# Patient Record
Sex: Male | Born: 1990 | Race: White | Hispanic: No | Marital: Single | Smoking: Never smoker
Health system: Southern US, Community
[De-identification: ages and names within clinical notes are randomized; demographics above are authoritative.]

## PROBLEM LIST (undated history)

## (undated) HISTORY — PX: CARDIAC SURGERY: SHX584

---
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
## 2009-05-24 ENCOUNTER — Ambulatory Visit (HOSPITAL_BASED_OUTPATIENT_CLINIC_OR_DEPARTMENT_OTHER): Admission: RE | Admit: 2009-05-24 | Discharge: 2009-05-24 | Payer: Self-pay | Admitting: Oral Surgery

## 2011-02-18 NOTE — Op Note (Signed)
NAMEESTUARDO, Cole Rios                ACCOUNT NO.:  [id number]   MEDICAL RECORD NO.:  [id number]          PATIENT TYPE:  AMB   LOCATION:  DSC                          FACILITY:  [facility]   PHYSICIAN:  Hinton Dyer, D.D.S.DATE OF BIRTH:  29-Mar-1991   DATE OF PROCEDURE:  05/24/2009  DATE OF DISCHARGE:                               OPERATIVE REPORT   PREOPERATIVE DIAGNOSES:  Multiple impacted teeth and retained primary  teeth.   POSTOPERATIVE DIAGNOSES:  Multiple impacted teeth and retained primary  teeth.   PROCEDURES:  1. Surgical removal of 4 impacted wisdom teeth #1, 16, 17, and 32.  2. Removal of retained primary teeth numbers B, L, R, S, and exposure      of impacted second molars #2, 15, 18, 31, and removal of impacted      bicuspid #5 and #12.   ANESTHESIA:  General.   SURGEON:  Hinton Dyer, DDS   ASSISTANTS:  1. Ray Montez Morita.  2. Montel Culver.   ESTIMATED BLOOD LOSS:  30 mL.   CONDITION ON SURGERY:  Good.   Following preoperative medication, the patient was brought to the  operating room in supine position in which he remained throughout the  whole procedure.  He was intubated by a right nasoendotracheal tube and  prepped and draped in the usual fashion for an intraoral procedure.  The  mouth was suctioned dry.  The throat was suctioned dry and a moist open  4 x 4 gauze was placed around the endotracheal tube.  Four carpules of  2% Xylocaine with 1:100,000 epinephrine was given as bilateral blocks  and infiltrations of the maxilla and palate.  A child-sized bite block  was used.  Starting on the patient's left side, an incision was made  over the left tuberosity removing a block of tissue covering tooth #15.  A full-thickness mucoperiosteal flap was elevated with a periosteal  elevator around the impacted wisdom tooth.  The soft tissue and bone  covering the wisdom tooth was removed with a periosteal elevator and a  rongeur.  Once the tooth could be visualized, it  was elevated out of the  socket with an 11-A elevator.  The socket was curetted and irrigated and  the soft tissue was closed with 3-0 chromic sutures.  The impacted tooth  #15 was still exposed.  An incision was then made over the left  retromolar pad extending to tooth #19.  A wedge of tissue was removed  covering the impacted tooth #18.  A full-thickness mucoperiosteal flap  was elevated distal to this.  The soft tissue covering the impacted  tooth #18 was removed with a rongeur and a periosteal elevator.  The  area where the impacted wisdom tooth was was unroofed using a round bur  and copious irrigation until the crown could be visualized.  It was then  sectioned longitudinally and split with an 11-A elevator.  Each part was  then removed with an 11-A elevator and the socket was curetted and  irrigated.  The soft tissue was then closed in a way to keep #18  exposed.  3-0 chromic sutures were used.  The primary retained molar  tooth L was removed with a rongeur.  The socket was curetted and the  soft tissue was removed with a rongeur.  On the patient's right side,  the 2 primary teeth, R and S were then removed using a lower universal  forceps.  The sockets were curetted and soft tissue was removed with a  rongeur.  A 15 blade made an incision over the retromolar pad and a  wedge was removed covering tooth #31.  Soft tissue covering 31  underneath the flap was also removed with a periosteal elevator and a  rongeur.  The bone covering #32 was then removed with a round bur and  copious irrigation.  The tooth was sectioned longitudinally with a round  bur and split with an 11-A elevator.  Each portion was removed with an  11-A elevator and the socket was curetted aggressively and irrigated.  The bone covering a part of 31 was removed with a round bur exposing the  crown.  The soft tissue was then closed in a way that 31 was still  exposed using 3-0 chromic sutures.  Attention was then  turned to the  maxilla where a 15 blade was used to make an incision over the right  tuberosity with the wedge removed covering tooth #2.  The wedge was  removed with a rongeur and the soft tissue was removed exposing the  crown.  The flap was then developed so that the palpably impacted tooth  #1 was exposed and then a rongeur was used to remove bone covering the  crown.  The tooth was then elevated out of the socket with an 11-A  elevator.  There was no supernumerary that could be visualized.  The  socket was curetted and the soft tissue was closed so that the impacted  #2 was still visualized.  The primary tooth B was then removed with a  rongeur.  A 15 blade made an incision on the palate so as a flap could  be developed using a periosteal elevator to expose the impacted tooth  #5.  It was mobilized with an 11-A elevator and removed with an upper  Universal forceps.  The socket was curetted and closed with 3-0 chromic  sutures.  A periosteal elevator went around tooth #12 and it was removed  with an upper Universal forceps.  The socket was curetted.  The buccal  plates were intact.  The soft tissue was closed with a 3-0 chromic  suture.  The patient tolerated the procedure well.  The throat pack was  removed, and the patient was extubated on the table and returned to the  recovery room in good condition.  He was given written home care and  diet instruction after discharge and was sent home with a prescription  for Vicodin.  The patient will be followed by me in my private office.           ______________________________  Hinton Dyer, D.D.S.     JLM/MEDQ  D:  05/24/2009  T:  05/24/2009  Job:  [id number]

## 2014-06-06 ENCOUNTER — Ambulatory Visit
Admission: RE | Admit: 2014-06-06 | Discharge: 2014-06-06 | Disposition: A | Discharge status: Home / Self Care | Payer: BC Managed Care – PPO | Source: Ambulatory Visit | Attending: Family Medicine | Admitting: Family Medicine

## 2014-06-06 ENCOUNTER — Other Ambulatory Visit: Payer: Self-pay | Admitting: Family Medicine

## 2014-06-06 DIAGNOSIS — Q909 Down syndrome, unspecified: Secondary | ICD-10-CM

## 2016-02-18 IMAGING — CR DG CERVICAL SPINE 2 OR 3 VIEWS
3 series · 3 of 3 positions shown · non-contrast
Comparison: None.

CLINICAL DATA: History of Down syndrome check for instability

EXAM:
CERVICAL SPINE - 2-3 VIEW

[w c-spine lat (1 of 3)]
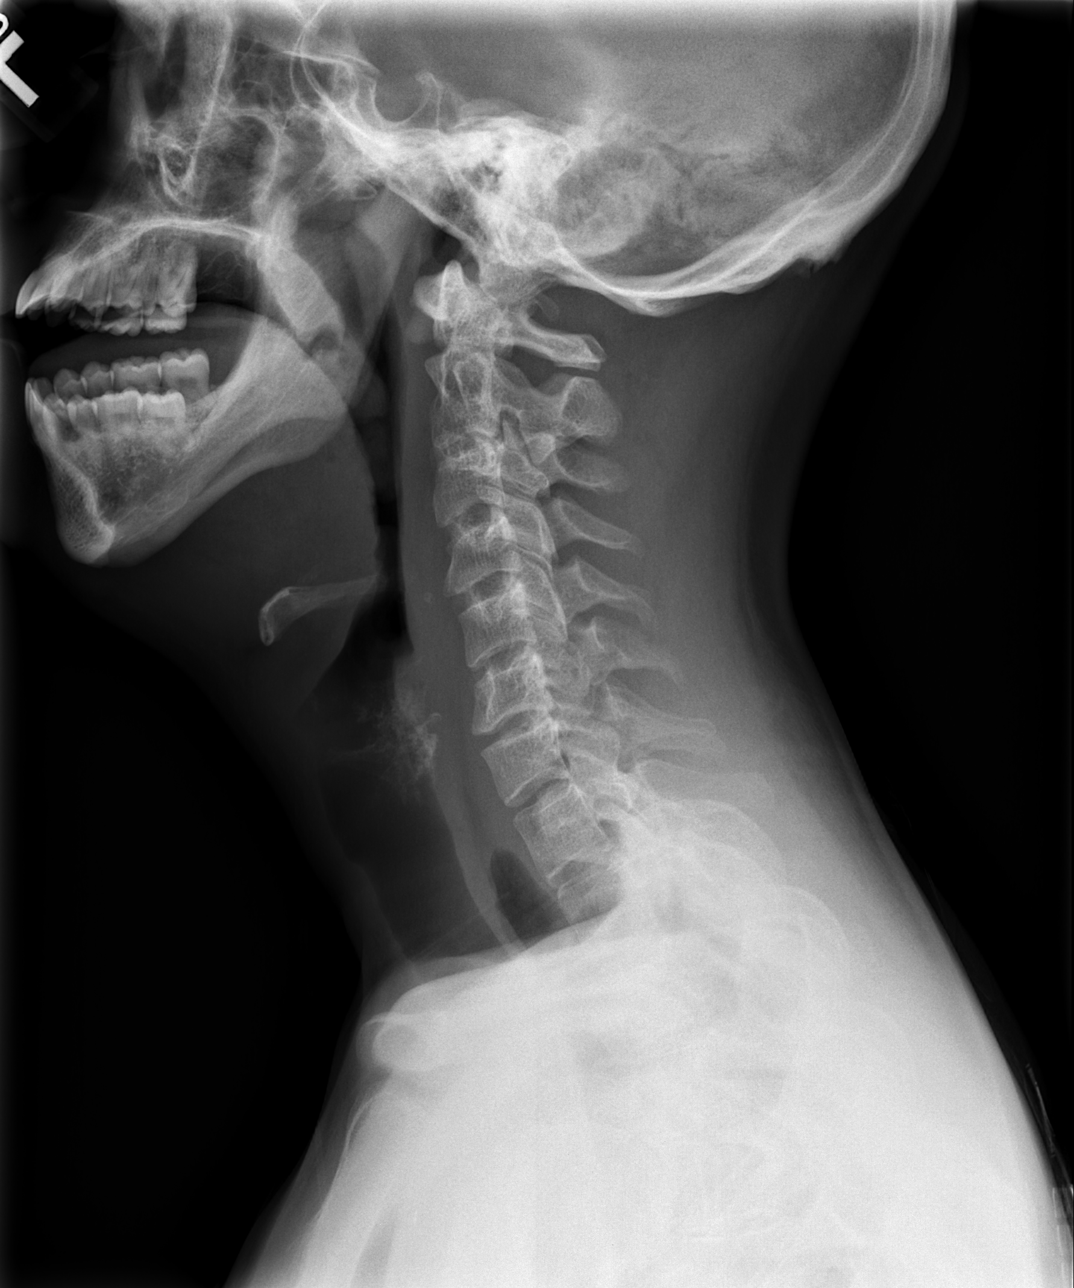

[w c-spine lat (2 of 3)]
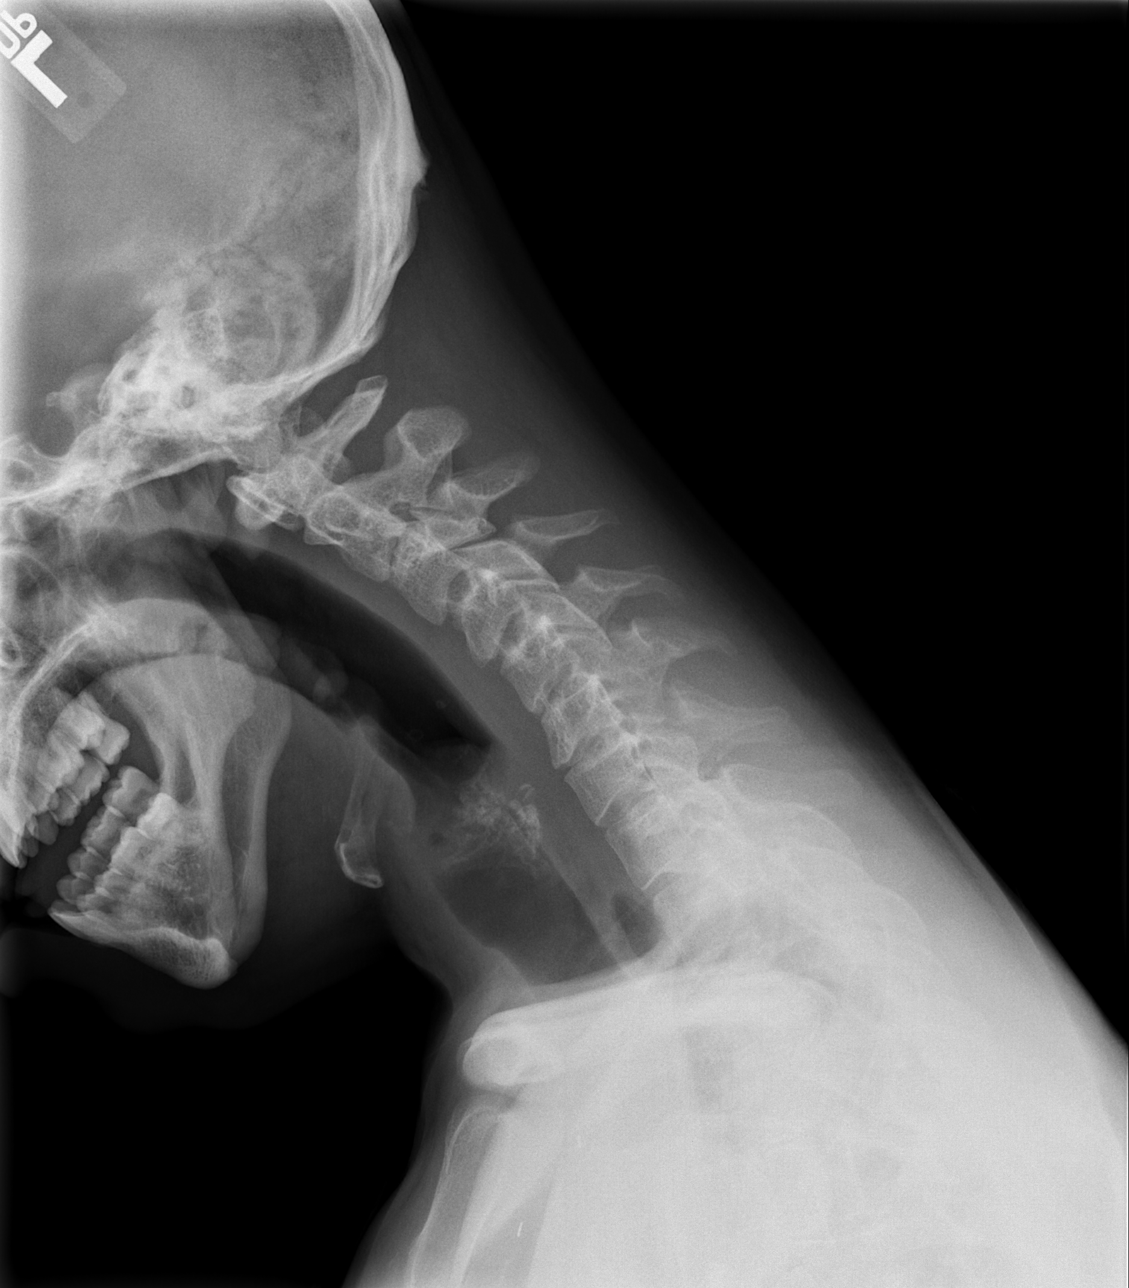

[w c-spine lat (3 of 3)]
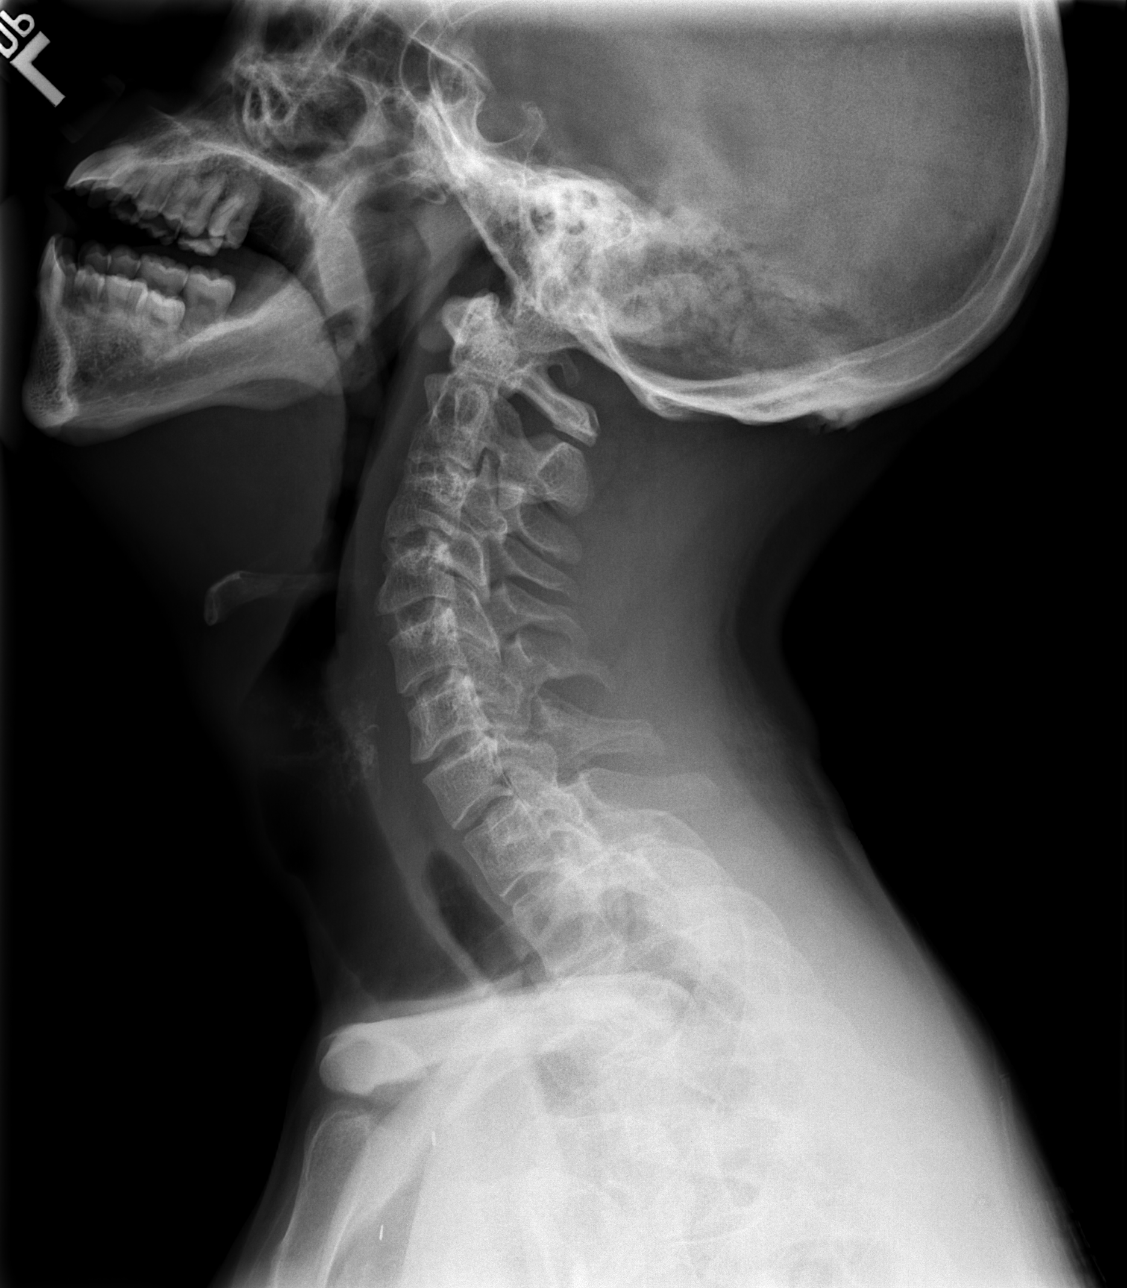

[3 of 3 positions shown; findings below may reference images not displayed]

FINDINGS: Neutral flexion and extension views of the cervical spine were
obtained. No acute bony abnormality is noted. On flexion and
extension no significant instability is seen. No prevertebral soft
tissue changes are noted.
IMPRESSION: No acute abnormality seen.

## 2018-06-08 ENCOUNTER — Encounter: Payer: Self-pay | Admitting: Podiatry

## 2018-06-08 ENCOUNTER — Ambulatory Visit (INDEPENDENT_AMBULATORY_CARE_PROVIDER_SITE_OTHER): Payer: Medicaid Other | Admitting: Podiatry

## 2018-06-08 DIAGNOSIS — M79674 Pain in right toe(s): Secondary | ICD-10-CM | POA: Diagnosis not present

## 2018-06-08 DIAGNOSIS — M79675 Pain in left toe(s): Secondary | ICD-10-CM | POA: Diagnosis not present

## 2018-06-08 DIAGNOSIS — B351 Tinea unguium: Secondary | ICD-10-CM | POA: Diagnosis not present

## 2018-06-08 NOTE — Progress Notes (Signed)
   Subjective:    Patient ID: Cole Rios, male    DOB: [birth year], 27 y.o.   MRN: [id number]  HPI 27 year old male presents the office today with his parents for concerns of toenail thickening discoloration of the second digit toenails.  This is been getting worse for the last 6 weeks.  He has been using ciclopirox without any significant improvement.  Nails, occasionally uncomfortable as are getting thick.  Denies any redness or drainage or any swelling.  No other concerns today.   Review of Systems  All other systems reviewed and are negative.  History reviewed. No pertinent past medical history.  History reviewed. No pertinent surgical history.   Current Outpatient Medications:  Marland Kitchen  Multiple Vitamin (MULTIVITAMIN) tablet, Take 1 tablet by mouth daily., Disp: , Rfl:  .  NON FORMULARY, [facility]  Onychomycosis Nail Lacquer -  Fluconazole 2%, Terbinafine 1% DMSO Apply to affected nail once daily Qty. 120 gm 3 refills, Disp: , Rfl:   No Known Allergies       Objective:   Physical Exam General: NAD  Dermatological: Nails are hypertrophic, dystrophic, brittle, discolored, elongated 10.  Second digit toenails are the worst.  No surrounding redness or drainage. Tenderness nails 1-5 bilaterally. No open lesions or pre-ulcerative lesions are identified today.  Vascular: Dorsalis Pedis artery and Posterior Tibial artery pedal pulses are 2/4 bilateral with immedate capillary fill time. There is no pain with calf compression, swelling, warmth, erythema.   Neruologic: Grossly intact via light touch bilateral. . Protective threshold with Semmes Wienstein monofilament intact to all pedal sites bilateral.   Musculoskeletal: No gross boney pedal deformities bilateral. No pain, crepitus, or limitation noted with foot and ankle range of motion bilateral. Muscular strength 5/5 in all groups tested bilateral.  Gait: Unassisted, Nonantalgic.     Assessment & Plan:  27 year old male  with symptomatic onychomycosis -Treatment options discussed including all alternatives, risks, and complications -Etiology of symptoms were discussed -We discussed various treatment options.  Patients with Down syndrome do a high incidence of onychomycosis that we discussed.  They want to proceed with treatment.  I ordered a compound ointment through [employer] we discussed application instructions, side effects and success rates.  As a courtesy I debrided the nails without any complications or bleeding.  Vivi Barrack DPM

## 2018-06-09 ENCOUNTER — Telehealth: Payer: Self-pay | Admitting: Podiatry

## 2018-06-09 NOTE — Telephone Encounter (Signed)
This is [name], calling in regards to my son Cole Rios who saw Dr. Ardelle Anton yesterday. I was wanting to speak to Misty Stanley about the prescription that was prescribed. If you could give me a call at [phone]. Thank you. Bye bye.

## 2018-06-09 NOTE — Telephone Encounter (Signed)
Pt's mtr, Nicholos Johns asked if Dr. Ardelle Anton really liked the combination medication he prescribed for pt yesterday, because Medicaid did not cover. I told Nicholos Johns we prescribed the [facility] Nail lacquer a lot and had good results.

## 2018-09-07 ENCOUNTER — Ambulatory Visit (INDEPENDENT_AMBULATORY_CARE_PROVIDER_SITE_OTHER): Payer: Medicaid Other | Admitting: Podiatry

## 2018-09-07 ENCOUNTER — Encounter: Payer: Self-pay | Admitting: Podiatry

## 2018-09-07 DIAGNOSIS — B351 Tinea unguium: Secondary | ICD-10-CM

## 2018-09-13 NOTE — Progress Notes (Signed)
Subjective: 27 year old male presents the office today for follow-up evaluation of thick, painful, elongated toenails that he cannot trim himself as well as from the nail fungus.  They have been continue the nail fungus treatment anything the color is getting better.  No redness or drainage or swelling to the toenail sites. Denies any systemic complaints such as fevers, chills, nausea, vomiting. No acute changes since last appointment, and no other complaints at this time.   Objective: AAO x3, NAD DP/PT pulses palpable bilaterally, CRT less than 3 seconds Nails are hypertrophic, dystrophic, discolored with yellow to brown discoloration.  There is no pain in the nails there is no surrounding redness or drainage or any signs of infection identified today. No open lesions or pre-ulcerative lesions.  No pain with calf compression, swelling, warmth, erythema  Assessment: Onychomycosis  Plan: -All treatment options discussed with the patient including all alternatives, risks, complications.  -As a courtesy I debrided the nails without any complications or bleeding.  We will continue the topical antifungal medicine as well. -Patient encouraged to call the office with any questions, concerns, change in symptoms.   Vivi BarrackMatthew R Wagoner DPM

## 2018-11-16 ENCOUNTER — Telehealth: Payer: Self-pay | Admitting: Podiatry

## 2018-11-16 MED ORDER — NONFORMULARY OR COMPOUNDED ITEM: 5 refills | Status: DC

## 2018-11-16 NOTE — Telephone Encounter (Signed)
I informed pt's mtr, Nicholos Johns of the change to [place] [phone], and they would call with coverage as well as delivery information. Faxed orders to [facility].

## 2018-11-16 NOTE — Telephone Encounter (Signed)
Pt mom was previously receiving medication from [facility]. Needs a refill. Please call patient

## 2018-11-16 NOTE — Addendum Note (Signed)
Addended by: Alphia Kava D on: 11/16/2018 02:14 PM   Modules accepted: Orders

## 2018-12-07 ENCOUNTER — Ambulatory Visit: Payer: Medicaid Other | Admitting: Podiatry

## 2018-12-10 ENCOUNTER — Ambulatory Visit: Payer: Medicaid Other | Admitting: Podiatry

## 2018-12-10 ENCOUNTER — Encounter: Payer: Self-pay | Admitting: Podiatry

## 2018-12-10 DIAGNOSIS — M79674 Pain in right toe(s): Secondary | ICD-10-CM | POA: Diagnosis not present

## 2018-12-10 DIAGNOSIS — M79675 Pain in left toe(s): Secondary | ICD-10-CM | POA: Diagnosis not present

## 2018-12-10 DIAGNOSIS — B351 Tinea unguium: Secondary | ICD-10-CM

## 2018-12-10 NOTE — Patient Instructions (Signed)

## 2018-12-22 ENCOUNTER — Encounter: Payer: Self-pay | Admitting: Podiatry

## 2018-12-22 NOTE — Progress Notes (Signed)
Subjective: AMPARO COSTELLA presents today accompanied by his parents with painful, thick toenails 1-5 b/l that he cannot cut and which interfere with daily activities.  Pain is aggravated when wearing enclosed shoe gear.  Parents have been applying [facility] Topical antifungal solution daily.   Darrow Bussing, MD    Current Outpatient Medications:  Marland Kitchen  Multiple Vitamin (MULTIVITAMIN) tablet, Take 1 tablet by mouth daily., Disp: , Rfl:  .  NON FORMULARY, [facility]  Onychomycosis Nail Lacquer -  Fluconazole 2%, Terbinafine 1% DMSO Apply to affected nail once daily Qty. 120 gm 3 refills, Disp: , Rfl:  .  NONFORMULARY OR COMPOUNDED ITEM, Terbina 3%, Fluconazole 2%, Tea Tree Oil 5%, Urea 10%, Ibuprofen 2% in DMSO suspension #17ml. Apply to affected nail(s) once (at bedtime) or twice daily., Disp: 30 each, Rfl: 5  No Known Allergies  Objective:  Vascular Examination: Capillary refill time immediate x 10 digits  Dorsalis pedis and Posterior tibial pulses palpable b/l  Digital hair present x 10 digits  Skin temperature gradient WNL b/l  Dermatological Examination: Skin with normal turgor, texture and tone b/l  Toenails 1-5 b/l discolored, thick, dystrophic with subungual debris and pain with palpation to nailbeds due to thickness of nails.  Musculoskeletal: Muscle strength 5/5 to all LE muscle groups  No gross bony deformities b/l.  No pain, crepitus or joint limitation noted with ROM.   Neurological: Sensation intact with 10 gram monofilament. Vibratory sensation intact.  Assessment: Painful onychomycosis toenails 1-5 b/l   Plan: 1. Toenails 1-5 b/l were debrided in length and girth without iatrogenic bleeding. 2. Continue [facility] Topical antifungal as prescribed by Dr. Ardelle Anton. 3. Patient to continue soft, supportive shoe gear 4. Patient to report any pedal injuries to medical professional immediately. 5. Follow up 3 months.  6. Patient/POA to call should there be  a concern in the interim.

## 2019-03-21 ENCOUNTER — Other Ambulatory Visit: Payer: Self-pay

## 2019-03-21 ENCOUNTER — Ambulatory Visit (INDEPENDENT_AMBULATORY_CARE_PROVIDER_SITE_OTHER): Payer: Medicaid Other | Admitting: Podiatry

## 2019-03-21 ENCOUNTER — Ambulatory Visit: Payer: Medicaid Other | Admitting: Podiatry

## 2019-03-21 ENCOUNTER — Encounter: Payer: Self-pay | Admitting: Podiatry

## 2019-03-21 DIAGNOSIS — M79674 Pain in right toe(s): Secondary | ICD-10-CM

## 2019-03-21 DIAGNOSIS — M79675 Pain in left toe(s): Secondary | ICD-10-CM

## 2019-03-21 DIAGNOSIS — B351 Tinea unguium: Secondary | ICD-10-CM

## 2019-03-21 NOTE — Patient Instructions (Signed)

## 2019-03-31 NOTE — Progress Notes (Signed)
Subjective:  Cole Rios presents to clinic accompanied by his mother on today.  He presents for follow-up eyes cc of  painful, thick, discolored, elongated toenails 1-5 b/l that become tender and cannot cut because of thickness.  He has continue to use the compounded antifungal medication from [place].   Koirala, Dibas, MD is his PCP.  Medications reviewed.  No Known Allergies   Objective:  Physical Examination: Essentially unchanged.  Vascular Examination: Capillary refill time immediate x 10 digits.  Palpable DP/PT pulses b/l.  Digital hair present b/l.  No edema noted b/l.  Skin temperature gradient WNL b/l.  Dermatological Examination: Skin with normal turgor, texture and tone b/l.  No open wounds b/l.  No interdigital macerations noted b/l.  Elongated, thick, discolored brittle toenails with subungual debris and pain on dorsal palpation of nailbeds 1-5 b/l.  Musculoskeletal Examination: Muscle strength 5/5 to all muscle groups b/l  No pain, crepitus or joint discomfort with active/passive ROM.  Neurological Examination: Sensation intact 5/5 b/l with 10 gram monofilament.  Vibratory sensation intact b/l.  Proprioceptive sensation intact b/l.  Assessment: Mycotic nail infection with pain 1-5 b/l  Plan: 1. Toenails 1-5 b/l were debrided in length and girth without iatrogenic laceration. 2.  Continue soft, supportive shoe gear daily. 3.  Report any pedal injuries to medical professional. 4.  Follow up 3 months. 5.  Patient/POA to call should there be a question/concern in there interim.

## 2019-05-30 ENCOUNTER — Other Ambulatory Visit: Payer: Self-pay

## 2019-05-30 ENCOUNTER — Ambulatory Visit: Payer: Medicaid Other | Admitting: Podiatry

## 2019-05-30 VITALS — Temp 97.9°F

## 2019-05-30 DIAGNOSIS — M79674 Pain in right toe(s): Secondary | ICD-10-CM | POA: Diagnosis not present

## 2019-05-30 DIAGNOSIS — M79675 Pain in left toe(s): Secondary | ICD-10-CM

## 2019-05-30 DIAGNOSIS — B351 Tinea unguium: Secondary | ICD-10-CM

## 2019-05-30 NOTE — Patient Instructions (Signed)

## 2019-06-06 ENCOUNTER — Encounter: Payer: Self-pay | Admitting: Podiatry

## 2019-06-06 NOTE — Progress Notes (Signed)
Subjective:  Cole Rios presents to clinic today with cc of  painful, thick, discolored, elongated toenails 1-5 b/l that become tender and cannot cut because of thickness. Pain is aggravated when wearing enclosed shoe gear. He continues to use topical antifungal on toenails and both he and his mother note improvement in nails.   Current Outpatient Medications:  Marland Kitchen  Multiple Vitamin (MULTIVITAMIN) tablet, Take 1 tablet by mouth daily., Disp: , Rfl:  .  NON FORMULARY, [facility]  Onychomycosis Nail Lacquer -  Fluconazole 2%, Terbinafine 1% DMSO Apply to affected nail once daily Qty. 120 gm 3 refills, Disp: , Rfl:  .  NONFORMULARY OR COMPOUNDED ITEM, Terbina 3%, Fluconazole 2%, Tea Tree Oil 5%, Urea 10%, Ibuprofen 2% in DMSO suspension #38ml. Apply to affected nail(s) once (at bedtime) or twice daily., Disp: 30 each, Rfl: 5   No Known Allergies   Objective: Vitals:   05/30/19 0956  Temp: 97.9 F (36.6 C)    Physical Examination:  Vascular Examination: Capillary refill time immediate x 10 digits.  DP/PT pulses both palpable b/l.  Digital hair present b/l.  No edema noted b/l.  Skin temperature gradient WNL b/l.  Dermatological Examination: Skin with normal turgor, texture and tone b/l.  No open wounds b/l.  No interdigital macerations noted b/l.  Elongated, thick, discolored brittle toenails with subungual debris and pain on dorsal palpation of nailbeds 1-5 b/l. Improvement in proximal 1/3 of nailplates digits 1-5 b/l.  Musculoskeletal Examination: Muscle strength 5/5 to all muscle groups b/l.  No pain, crepitus or joint discomfort with active/passive ROM.  Neurological Examination: Sensation intact 5/5 b/l with 10 gram monofilament  Assessment: Mycotic nail infection with pain 1-5 b/l  Plan: 1. Toenails 1-5 b/l were debrided in length and girth without iatrogenic laceration. Continue topical antifungal medication daily. 2.  Continue soft, supportive shoe  gear daily. 3.  Report any pedal injuries to medical professional. 4.  Follow up 3 months. 5.  Patient/POA to call should there be a question/concern in there interim.

## 2019-08-08 ENCOUNTER — Ambulatory Visit: Payer: Medicaid Other | Admitting: Podiatry

## 2019-08-08 ENCOUNTER — Other Ambulatory Visit: Payer: Self-pay

## 2019-08-08 ENCOUNTER — Encounter: Payer: Self-pay | Admitting: Podiatry

## 2019-08-08 DIAGNOSIS — B351 Tinea unguium: Secondary | ICD-10-CM

## 2019-08-08 DIAGNOSIS — M79675 Pain in left toe(s): Secondary | ICD-10-CM | POA: Diagnosis not present

## 2019-08-08 DIAGNOSIS — M79674 Pain in right toe(s): Secondary | ICD-10-CM | POA: Diagnosis not present

## 2019-08-10 NOTE — Progress Notes (Signed)
Subjective:  Cole Rios presents to clinic today with cc of  painful, thick, discolored, elongated toenails 1-5 b/l that become tender and cannot cut because of thickness. Pain is aggravated when wearing enclosed shoe gear.  He continues to use topical antifungal and voices no new problems on today's visit.  Current Outpatient Medications on File Prior to Visit  Medication Sig Dispense Refill  . Multiple Vitamin (MULTIVITAMIN) tablet Take 1 tablet by mouth daily.    [facility] Scarlet FORMULARY [facility]  Onychomycosis Nail Lacquer -  Fluconazole 2%, Terbinafine 1% DMSO Apply to affected nail once daily Qty. 120 gm 3 refills    . NONFORMULARY OR COMPOUNDED ITEM Terbina 3%, Fluconazole 2%, Tea Tree Oil 5%, Urea 10%, Ibuprofen 2% in DMSO suspension #61ml. Apply to affected nail(s) once (at bedtime) or twice daily. 30 each 5   No current facility-administered medications on file prior to visit.      No Known Allergies   Objective: There were no vitals filed for this visit.  Physical Examination:  Neurovascular status intact and unchanged b/l.   Dermatological Examination: Skin with normal turgor, texture and tone b/l.  No open wounds b/l.  No interdigital macerations noted b/l.  Elongated, thick, discolored brittle toenails with subungual debris and pain on dorsal palpation of nailbeds 1-5 b/l.  Musculoskeletal Examination: Muscle strength 5/5 to all muscle groups b/l  No pain, crepitus or joint discomfort with active/passive ROM.  Proprioceptive sensation intact b/l.  Assessment: Mycotic nail infection with pain 1-5 b/l  Plan: 1. Toenails 1-5 b/l were debrided in length and girth without iatrogenic laceration. Continue compounded topical antifungal medication daily.  2.  Continue soft, supportive shoe gear daily. 3.  Report any pedal injuries to medical professional. 4.  Follow up 3 months. 5.  Patient/POA to call should there be a question/concern in there  interim.

## 2019-10-24 ENCOUNTER — Encounter: Payer: Self-pay | Admitting: Podiatry

## 2019-10-24 ENCOUNTER — Other Ambulatory Visit: Payer: Self-pay

## 2019-10-24 ENCOUNTER — Ambulatory Visit: Payer: Medicaid Other | Admitting: Podiatry

## 2019-10-24 DIAGNOSIS — M79675 Pain in left toe(s): Secondary | ICD-10-CM

## 2019-10-24 DIAGNOSIS — B351 Tinea unguium: Secondary | ICD-10-CM | POA: Diagnosis not present

## 2019-10-24 DIAGNOSIS — M79674 Pain in right toe(s): Secondary | ICD-10-CM | POA: Diagnosis not present

## 2019-10-24 NOTE — Patient Instructions (Signed)

## 2019-10-24 NOTE — Progress Notes (Signed)
Subjective: Cole Rios is seen today for follow up painful, elongated, thickened toenails bilateral feet that he cannot cut. Pain interferes with daily activities. Aggravating factor includes wearing enclosed shoe gear and relieved with periodic debridement.  He is continuing to use the compounded topical antifungal from [place].   Medications reviewed in chart.  No Known Allergies   Objective:  Vascular Examination: Capillary refill time immediate b/l.  Dorsalis pedis present b/l.  Posterior tibial pulses present b/l.  Digital hair  present x 10 digits.  Skin temperature gradient WNL b/l.   Dermatological Examination: Skin with normal turgor, texture and tone b/l.  Toenails 1-5 b/l discolored, thick, dystrophic with subungual debris and pain with palpation to nailbeds due to thickness of nails.  Musculoskeletal: Muscle strength 5/5 to all LE muscle groups b/l.  No gross bony deformities b/l.  No pain, crepitus or joint limitation noted with ROM.   Neurological Examination: Protective sensation intact with 10 gram monofilament bilaterally.  Epicritic sensation present bilaterally.  Vibratory sensation intact bilaterally.   Assessment: Painful onychomycosis toenails 1-5 b/l   Plan: 1. Toenails 1-5 b/l were debrided in length and girth without iatrogenic bleeding.  Continue compounded topical antifungal from [place] daily.  2. Patient to continue soft, supportive shoe gear daily. 3. Patient to report any pedal injuries to medical professional immediately. 4. Follow up 10 weeks. 5. Patient/POA to call should there be a concern in the interim.

## 2019-12-09 ENCOUNTER — Ambulatory Visit: Payer: Medicaid Other | Attending: Internal Medicine

## 2019-12-09 DIAGNOSIS — Z23 Encounter for immunization: Secondary | ICD-10-CM

## 2019-12-09 NOTE — Progress Notes (Signed)
   Covid-19 Vaccination Clinic  Name:  Cole Rios    MRN: [id number] DOB: 1991-07-21  12/09/2019  Mr. Cole Rios was observed post Covid-19 immunization for 15 minutes without incident. He was provided with Vaccine Information Sheet and instruction to access the V-Safe system.   Mr. Cole Rios was instructed to call 911 with any severe reactions post vaccine: Marland Kitchen Difficulty breathing  . Swelling of face and throat  . A fast heartbeat  . A bad rash all over body  . Dizziness and weakness   Immunizations Administered    Name Date Dose VIS Date Route   Pfizer COVID-19 Vaccine 12/09/2019 12:44 PM 0.3 mL 09/16/2019 Intramuscular   Manufacturer: [employer], [employer]   Lot: [id number]   NDC: [address]-4

## 2019-12-19 ENCOUNTER — Telehealth: Payer: Self-pay | Admitting: *Deleted

## 2019-12-19 MED ORDER — NONFORMULARY OR COMPOUNDED ITEM: 5 refills | Status: AC

## 2019-12-19 NOTE — Telephone Encounter (Signed)
Pt's mtr, Olegario Messier request refill of the topical for his toenails.

## 2019-12-19 NOTE — Telephone Encounter (Signed)
I spoke with Cole Rios and informed that I would refill the antifungal topical and to call me if future refills were needed. Faxed orders to [facility].

## 2020-01-03 ENCOUNTER — Ambulatory Visit: Payer: Medicaid Other | Admitting: Podiatry

## 2020-01-03 ENCOUNTER — Other Ambulatory Visit: Payer: Self-pay

## 2020-01-03 ENCOUNTER — Encounter: Payer: Self-pay | Admitting: Podiatry

## 2020-01-03 VITALS — Temp 96.3°F

## 2020-01-03 DIAGNOSIS — M79675 Pain in left toe(s): Secondary | ICD-10-CM

## 2020-01-03 DIAGNOSIS — B351 Tinea unguium: Secondary | ICD-10-CM

## 2020-01-03 DIAGNOSIS — M79674 Pain in right toe(s): Secondary | ICD-10-CM

## 2020-01-03 NOTE — Patient Instructions (Signed)

## 2020-01-04 ENCOUNTER — Ambulatory Visit: Payer: Medicaid Other | Attending: Internal Medicine

## 2020-01-04 DIAGNOSIS — Z23 Encounter for immunization: Secondary | ICD-10-CM

## 2020-01-04 NOTE — Progress Notes (Signed)
   Covid-19 Vaccination Clinic  Name:  Cole Rios    MRN: [id number] DOB: [birth year]  01/04/2020  Cole Rios was observed post Covid-19 immunization for 15 minutes without incident. He was provided with Vaccine Information Sheet and instruction to access the V-Safe system.   Cole Rios was instructed to call 911 with any severe reactions post vaccine: Marland Kitchen Difficulty breathing  . Swelling of face and throat  . A fast heartbeat  . A bad rash all over body  . Dizziness and weakness   Immunizations Administered    Name Date Dose VIS Date Route   Pfizer COVID-19 Vaccine 01/04/2020 12:52 PM 0.3 mL 09/16/2019 Intramuscular   Manufacturer: [employer], [employer]   Lot: [id number]   NDC: [address]-1

## 2020-01-04 NOTE — Progress Notes (Signed)
Subjective: Cole Rios presents today for follow up of painful mycotic nails b/l that are difficult to trim. Pain interferes with ambulation. Aggravating factors include wearing enclosed shoe gear. Pain is relieved with periodic professional debridement.   He voices no new pedal complaints on today's visit.  No Known Allergies   Objective: Vitals:   01/03/20 1342  Temp: (!) 96.3 F (35.7 C)    Pt 29 y.o. year old male  in NAD. AAO x 3.   Vascular Examination:  Capillary refill time to digits immediate b/l. Palpable DP pulses b/l. Palpable PT pulses b/l. Pedal hair present b/l. Skin temperature gradient within normal limits b/l.  Dermatological Examination: Pedal skin with normal turgor, texture and tone bilaterally. No open wounds bilaterally. No interdigital macerations bilaterally. Toenails 1-5 b/l elongated, dystrophic, thickened, crumbly with subungual debris and tenderness to dorsal palpation. Improvement in b/l hallux nails.   Musculoskeletal: Normal muscle strength 5/5 to all lower extremity muscle groups bilaterally, no gross bony deformities bilaterally and no pain crepitus or joint limitation noted with ROM b/l  Neurological: Protective sensation intact 5/5 intact bilaterally with 10g monofilament b/l  Assessment: 1. Pain due to onychomycosis of toenails of both feet    Plan: -Toenails 1-5 b/l were debrided in length and girth with sterile nail nippers and dremel without iatrogenic bleeding. Continue [facility] topical antifungal daily to toenails. -Patient to continue soft, supportive shoe gear daily. -Patient to report any pedal injuries to medical professional immediately. -Patient/POA to call should there be question/concern in the interim.  Return in about 10 weeks (around 03/13/2020) for nail trim.

## 2020-03-20 ENCOUNTER — Other Ambulatory Visit: Payer: Self-pay

## 2020-03-20 ENCOUNTER — Ambulatory Visit (INDEPENDENT_AMBULATORY_CARE_PROVIDER_SITE_OTHER): Payer: Medicaid Other | Admitting: Podiatry

## 2020-03-20 ENCOUNTER — Encounter: Payer: Self-pay | Admitting: Podiatry

## 2020-03-20 DIAGNOSIS — M79674 Pain in right toe(s): Secondary | ICD-10-CM

## 2020-03-20 DIAGNOSIS — B351 Tinea unguium: Secondary | ICD-10-CM

## 2020-03-20 DIAGNOSIS — M79675 Pain in left toe(s): Secondary | ICD-10-CM

## 2020-03-27 NOTE — Progress Notes (Signed)
Subjective: Cole Rios is a 29 y.o. male patient seen today painful mycotic nails b/l that are difficult to trim. Pain interferes with ambulation. Aggravating factors include wearing enclosed shoe gear. Pain is relieved with periodic professional debridement.   His Mom is present during today's visit.  He continues to use the compounded topical antifungal from [place] daily.   History reviewed. No pertinent past medical history.  There are no problems to display for this patient.   Current Outpatient Medications on File Prior to Visit  Medication Sig Dispense Refill   NONFORMULARY OR COMPOUNDED ITEM Terbina 3%, Fluconazole 2%, Tea Tree Oil 5%, Urea 10%, Ibuprofen 2% in DMSO suspension #25ml. Apply to affected nail(s) once (at bedtime) or twice daily. 30 each 5   Multiple Vitamin (MULTIVITAMIN) tablet Take 1 tablet by mouth daily.     No current facility-administered medications on file prior to visit.    No Known Allergies  Objective: Physical Exam  General: Patient is a pleasant 29 y.o. Caucasian male WD, WN in NAD. AAO x 3.   Neurovascular Examination: Neurovascular status unchanged b/l lower extremities. Capillary refill time to digits immediate b/l. Palpable pedal pulses b/l LE. Pedal hair present. Lower extremity skin temperature gradient within normal limits.   Protective sensation intact 5/5 intact bilaterally with 10g monofilament b/l. Vibratory sensation intact b/l. Proprioception intact bilaterally.  Dermatological:  Pedal skin with normal turgor, texture and tone bilaterally. No open wounds bilaterally. No interdigital macerations bilaterally. Toenails 1-5 b/l elongated, discolored, dystrophic, thickened, crumbly with subungual debris and tenderness to dorsal palpation. Improvement in great toes noted.  Musculoskeletal:  Normal muscle strength 5/5 to all lower extremity muscle groups bilaterally. No pain crepitus or joint limitation noted with ROM b/l.  No gross bony deformities bilaterally.  Assessment and Plan:  1. Pain due to onychomycosis of toenails of both feet   2. Pain in toes of both feet    -Examined patient. -No new findings. No new orders. -Toenails 1-5 b/l were debrided in length and girth with sterile nail nippers and dremel without iatrogenic bleeding. We will continue compounded topical antifungal daily. -Patient to report any pedal injuries to medical professional immediately. -Patient to continue soft, supportive shoe gear daily. -Patient/POA to call should there be question/concern in the interim.  Return in about 9 weeks (around 05/22/2020) for nail trim.  Freddie Breech, DPM

## 2020-05-23 ENCOUNTER — Ambulatory Visit: Payer: Medicaid Other | Admitting: Podiatry

## 2020-05-23 ENCOUNTER — Other Ambulatory Visit: Payer: Self-pay

## 2020-05-23 ENCOUNTER — Encounter: Payer: Self-pay | Admitting: Podiatry

## 2020-05-23 DIAGNOSIS — M79674 Pain in right toe(s): Secondary | ICD-10-CM

## 2020-05-23 DIAGNOSIS — B351 Tinea unguium: Secondary | ICD-10-CM

## 2020-05-23 DIAGNOSIS — M79675 Pain in left toe(s): Secondary | ICD-10-CM

## 2020-05-23 NOTE — Patient Instructions (Signed)
Terbinafine oral granules What is this medicine? TERBINAFINE (TER bin a feen) is an antifungal medicine. It is used to treat certain kinds of fungal or yeast infections. This medicine may be used for other purposes; ask your health care provider or pharmacist if you have questions. COMMON BRAND NAME(S): Lamisil What should I tell my health care provider before I take this medicine? They need to know if you have any of these conditions:  drink alcoholic beverages  kidney disease  liver disease  an unusual or allergic reaction to Terbinafine, other medicines, foods, dyes, or preservatives  pregnant or trying to get pregnant  breast-feeding How should I use this medicine? Take this medicine by mouth. Follow the directions on the prescription label. Hold packet with cut line on top. Shake packet gently to settle contents. Tear packet open along cut line, or use scissors to cut across line. Carefully pour the entire contents of packet onto a spoonful of a soft food, such as pudding or other soft, non-acidic food such as mashed potatoes (do NOT use applesauce or a fruit-based food). If two packets are required for each dose, you may either sprinkle the content of both packets on one spoonful of non-acidic food, or sprinkle the contents of both packets on two spoonfuls of non-acidic food. Make sure that no granules remain in the packet. Swallow the mxiture of the food and granules without chewing. Take your medicine at regular intervals. Do not take it more often than directed. Take all of your medicine as directed even if you think you are better. Do not skip doses or stop your medicine early. Contact your pediatrician or health care professional regarding the use of this medicine in children. While this medicine may be prescribed for children as young as 4 years for selected conditions, precautions do apply. Overdosage: If you think you have taken too much of this medicine contact a poison control  center or emergency room at once. NOTE: This medicine is only for you. Do not share this medicine with others. What if I miss a dose? If you miss a dose, take it as soon as you can. If it is almost time for your next dose, take only that dose. Do not take double or extra doses. What may interact with this medicine? Do not take this medicine with any of the following medications:  thioridazine This medicine may also interact with the following medications:  beta-blockers  caffeine  cimetidine  cyclosporine  MAOIs like Carbex, Eldepryl, Marplan, Nardil, and Parnate  medicines for fungal infections like fluconazole and ketoconazole  medicines for irregular heartbeat like amiodarone, flecainide and propafenone  rifampin  SSRIs like citalopram, escitalopram, fluoxetine, fluvoxamine, paroxetine and sertraline  tricyclic antidepressants like amitriptyline, clomipramine, desipramine, imipramine, nortriptyline, and others  warfarin This list may not describe all possible interactions. Give your health care provider a list of all the medicines, herbs, non-prescription drugs, or dietary supplements you use. Also tell them if you smoke, drink alcohol, or use illegal drugs. Some items may interact with your medicine. What should I watch for while using this medicine? Your doctor may monitor your liver function. Tell your doctor right away if you have nausea or vomiting, loss of appetite, stomach pain on your right upper side, yellow skin, dark urine, light stools, or are over tired. This medicine may cause serious skin reactions. They can happen weeks to months after starting the medicine. Contact your health care provider right away if you notice fevers or flu-like symptoms   with a rash. The rash may be red or purple and then turn into blisters or peeling of the skin. Or, you might notice a red rash with swelling of the face, lips or lymph nodes in your neck or under your arms. You need to take  this medicine for 6 weeks or longer to cure the fungal infection. Take your medicine regularly for as long as your doctor or health care provider tells you to. What side effects may I notice from receiving this medicine? Side effects that you should report to your doctor or health care professional as soon as possible:  allergic reactions like skin rash or hives, swelling of the face, lips, or tongue  change in vision  dark urine  fever or infection  general ill feeling or flu-like symptoms  light-colored stools  loss of appetite, nausea  rash, fever, and swollen lymph nodes  redness, blistering, peeling or loosening of the skin, including inside the mouth  right upper belly pain  unusually weak or tired  yellowing of the eyes or skin Side effects that usually do not require medical attention (report to your doctor or health care professional if they continue or are bothersome):  changes in taste  diarrhea  hair loss  muscle or joint pain  stomach upset This list may not describe all possible side effects. Call your doctor for medical advice about side effects. You may report side effects to FDA at 1-800-FDA-1088. Where should I keep my medicine? Keep out of the reach of children. Store at room temperature between 15 and 30 degrees C (59 and 86 degrees F). Throw away any unused medicine after the expiration date. NOTE: This sheet is a summary. It may not cover all possible information. If you have questions about this medicine, talk to your doctor, pharmacist, or health care provider.  2020 Elsevier/Gold Standard (2018-12-31 15:35:11)  Onychomycosis/Fungal Toenails  WHAT IS IT? An infection that lies within the keratin of your nail plate that is caused by a fungus.  WHY ME? Fungal infections affect all ages, sexes, races, and creeds.  There may be many factors that predispose you to a fungal infection such as age, coexisting medical conditions such as diabetes, or an  autoimmune disease; stress, medications, fatigue, genetics, etc.  Bottom line: fungus thrives in a warm, moist environment and your shoes offer such a location.  IS IT CONTAGIOUS? Theoretically, yes.  You do not want to share shoes, nail clippers or files with someone who has fungal toenails.  Walking around barefoot in the same room or sleeping in the same bed is unlikely to transfer the organism.  It is important to realize, however, that fungus can spread easily from one nail to the next on the same foot.  HOW DO WE TREAT THIS?  There are several ways to treat this condition.  Treatment may depend on many factors such as age, medications, pregnancy, liver and kidney conditions, etc.  It is best to ask your doctor which options are available to you.  7. No treatment.   Unlike many other medical concerns, you can live with this condition.  However for many people this can be a painful condition and may lead to ingrown toenails or a bacterial infection.  It is recommended that you keep the nails cut short to help reduce the amount of fungal nail. 8. Topical treatment.  These range from herbal remedies to prescription strength nail lacquers.  About 40-50% effective, topicals require twice daily application for approximately  9 to 12 months or until an entirely new nail has grown out.  The most effective topicals are medical grade medications available through physicians offices. 9. Oral antifungal medications.  With an 80-90% cure rate, the most common oral medication requires 3 to 4 months of therapy and stays in your system for a year as the new nail grows out.  Oral antifungal medications do require blood work to make sure it is a safe drug for you.  A liver function panel will be performed prior to starting the medication and after the first month of treatment.  It is important to have the blood work performed to avoid any harmful side effects.  In general, this medication safe but blood work is  required. 10. Laser Therapy.  This treatment is performed by applying a specialized laser to the affected nail plate.  This therapy is noninvasive, fast, and non-painful.  It is not covered by insurance and is therefore, out of pocket.  The results have been very good with a 80-95% cure rate.  [facility] is the only practice in the area to offer this therapy. 11. Permanent Nail Avulsion.  Removing the entire nail so that a new nail will not grow back.

## 2020-05-26 NOTE — Progress Notes (Signed)
Subjective: Cole Rios is a 29 y.o. male patient seen today painful mycotic nails b/l that are difficult to trim. Pain interferes with ambulation. Aggravating factors include wearing enclosed shoe gear. Pain is relieved with periodic professional debridement.   His Mom is present during today's visit. He continues to use the compounded topical antifungal from [place] daily.   Cole Rios saw his PCP recently and Mom states they had liver function tests performed and his test was normal. She is inquiring about the use of oral Lamisil for onychomycosis.  History reviewed. No pertinent past medical history.  There are no problems to display for this patient.   Current Outpatient Medications on File Prior to Visit  Medication Sig Dispense Refill   Multiple Vitamin (MULTIVITAMIN) tablet Take 1 tablet by mouth daily.     NONFORMULARY OR COMPOUNDED ITEM Terbina 3%, Fluconazole 2%, Tea Tree Oil 5%, Urea 10%, Ibuprofen 2% in DMSO suspension #66ml. Apply to affected nail(s) once (at bedtime) or twice daily. 30 each 5   No current facility-administered medications on file prior to visit.    No Known Allergies  Objective: Physical Exam  General: Patient is a pleasant 29 y.o. Caucasian male WD, WN in NAD. AAO x 3.   Neurovascular Examination: Neurovascular status unchanged b/l lower extremities. Capillary refill time to digits immediate b/l. Palpable pedal pulses b/l LE. Pedal hair present. Lower extremity skin temperature gradient within normal limits.   Protective sensation intact 5/5 intact bilaterally with 10g monofilament b/l. Vibratory sensation intact b/l. Proprioception intact bilaterally.  Dermatological:  Pedal skin with normal turgor, texture and tone bilaterally. No open wounds bilaterally. No interdigital macerations bilaterally. Toenails 1-5 b/l elongated, discolored, dystrophic, thickened, crumbly with subungual debris and tenderness to dorsal palpation. Improvement  in great toes and digits 3-5 b/l. No improvement in b/l 2nd toes.  Musculoskeletal:  Normal muscle strength 5/5 to all lower extremity muscle groups bilaterally. No pain crepitus or joint limitation noted with ROM b/l. No gross bony deformities bilaterally.  Assessment and Plan:  1. Pain due to onychomycosis of toenails of both feet    -Examined patient. --Discussed oral Lamisil use. I have asked his mother to have the labs forwarded to me for review. We discussed 90 day course of oral terbinafine. Mother given information on terbinafine. She will discuss with her husband and they will let me know if they want to proceed. -Toenails 1-5 b/l were debrided in length and girth with sterile nail nippers and dremel without iatrogenic bleeding. We will continue compounded topical antifungal daily. -Patient to report any pedal injuries to medical professional immediately. -Patient to continue soft, supportive shoe gear daily. -Patient/POA to call should there be question/concern in the interim.  Return in about 9 weeks (around 07/25/2020) for nail trim.  Freddie Breech, DPM

## 2020-06-04 ENCOUNTER — Other Ambulatory Visit: Payer: Self-pay | Admitting: Podiatry

## 2020-06-04 ENCOUNTER — Telehealth: Payer: Self-pay | Admitting: Podiatry

## 2020-06-04 MED ORDER — TERBINAFINE HCL 250 MG PO TABS: 250.0000 mg | ORAL_TABLET | Freq: Every day | ORAL | 2 refills | Status: AC

## 2020-06-04 NOTE — Telephone Encounter (Signed)
Pt's mother wants terbenafin (sp?) called in for her son.

## 2020-07-24 ENCOUNTER — Other Ambulatory Visit: Payer: Self-pay

## 2020-07-24 ENCOUNTER — Ambulatory Visit (INDEPENDENT_AMBULATORY_CARE_PROVIDER_SITE_OTHER): Payer: Medicaid Other | Admitting: Podiatry

## 2020-07-24 DIAGNOSIS — B351 Tinea unguium: Secondary | ICD-10-CM | POA: Insufficient documentation

## 2020-07-24 DIAGNOSIS — M79674 Pain in right toe(s): Secondary | ICD-10-CM

## 2020-07-24 DIAGNOSIS — M79675 Pain in left toe(s): Secondary | ICD-10-CM | POA: Diagnosis not present

## 2020-07-29 ENCOUNTER — Encounter: Payer: Self-pay | Admitting: Podiatry

## 2020-07-29 NOTE — Progress Notes (Signed)
Subjective: Cole Rios is a 29 y.o. male patient seen today painful mycotic nails b/l that are difficult to trim. Pain interferes with ambulation. Aggravating factors include wearing enclosed shoe gear. Pain is relieved with periodic professional debridement.   His Mom is present during today's visit. He is about 2/3 complete with the oral terbinafine therapy. Cole Rios and his mother notice improvement in several toenails.   They voice no new pedal concerns on today's visit.  No past medical history on file.  Patient Active Problem List   Diagnosis Date Noted  . Pain due to onychomycosis of toenails of both feet 07/24/2020    Current Outpatient Medications on File Prior to Visit  Medication Sig Dispense Refill  . Multiple Vitamin (MULTIVITAMIN) tablet Take 1 tablet by mouth daily.    . NONFORMULARY OR COMPOUNDED ITEM Terbina 3%, Fluconazole 2%, Tea Tree Oil 5%, Urea 10%, Ibuprofen 2% in DMSO suspension #84ml. Apply to affected nail(s) once (at bedtime) or twice daily. 30 each 5  . terbinafine (LAMISIL) 250 MG tablet Take 1 tablet (250 mg total) by mouth daily. 30 tablet 2   No current facility-administered medications on file prior to visit.    No Known Allergies  Objective: Physical Exam  General: Patient is a pleasant 29 y.o. Caucasian male WD, WN in NAD. AAO x 3.   Neurovascular Examination: Neurovascular status unchanged b/l lower extremities. Capillary refill time to digits immediate b/l. Palpable pedal pulses b/l LE. Pedal hair present. Lower extremity skin temperature gradient within normal limits.   Protective sensation intact 5/5 intact bilaterally with 10g monofilament b/l. Vibratory sensation intact b/l. Proprioception intact bilaterally.  Dermatological:  Pedal skin with normal turgor, texture and tone bilaterally. No open wounds bilaterally. No interdigital macerations bilaterally. Toenails 1-5 b/l elongated, discolored, dystrophic, thickened, crumbly with  subungual debris and tenderness to dorsal palpation. Improvement in great toes b/l  and digits 3-5 b/l. Minimal  improvement in b/l 2nd toes.  Musculoskeletal:  Normal muscle strength 5/5 to all lower extremity muscle groups bilaterally. No pain crepitus or joint limitation noted with ROM b/l. No gross bony deformities bilaterally.  Assessment and Plan:  1. Pain due to onychomycosis of toenails of both feet    -Examined patient. -Continue oral Lamisil until 90 day therapy completed. We will re-evaluate nails at next visit. Explained to mother medication does stay in the system for a small amount of time after therapy is completed. -Toenails 1-5 b/l were debrided in length and girth with sterile nail nippers and dremel without iatrogenic bleeding. We will continue compounded topical antifungal daily. -Patient to report any pedal injuries to medical professional immediately. -Patient to continue soft, supportive shoe gear daily. -Patient/POA to call should there be question/concern in the interim.  Return in about 3 months (around 10/24/2020).  Freddie Breech, DPM

## 2020-08-17 ENCOUNTER — Ambulatory Visit: Payer: Medicaid Other | Admitting: Podiatry

## 2020-09-07 ENCOUNTER — Ambulatory Visit: Payer: Medicaid Other | Attending: Internal Medicine

## 2020-09-07 DIAGNOSIS — Z23 Encounter for immunization: Secondary | ICD-10-CM

## 2020-09-07 NOTE — Progress Notes (Signed)
   Covid-19 Vaccination Clinic  Name:  Cole Rios    MRN: [id number] DOB: 1991-06-22  09/07/2020  [name] was observed post Covid-19 immunization for 15 minutes without incident. He was provided with Vaccine Information Sheet and instruction to access the V-Safe system.   [name] was instructed to call 911 with any severe reactions post vaccine: Marland Kitchen Difficulty breathing  . Swelling of face and throat  . A fast heartbeat  . A bad rash all over body  . Dizziness and weakness   Immunizations Administered    Name Date Dose VIS Date Route   Pfizer COVID-19 Vaccine 09/07/2020  1:38 PM 0.3 mL 07/25/2020 Intramuscular   Manufacturer: [employer], [employer]   Lot: [id number]   NDC: [address]-5

## 2020-10-24 ENCOUNTER — Other Ambulatory Visit: Payer: Self-pay

## 2020-10-24 ENCOUNTER — Encounter: Payer: Self-pay | Admitting: Podiatry

## 2020-10-24 ENCOUNTER — Ambulatory Visit: Payer: Medicaid Other | Admitting: Podiatry

## 2020-10-24 DIAGNOSIS — M79674 Pain in right toe(s): Secondary | ICD-10-CM | POA: Diagnosis not present

## 2020-10-24 DIAGNOSIS — M79675 Pain in left toe(s): Secondary | ICD-10-CM

## 2020-10-24 DIAGNOSIS — B351 Tinea unguium: Secondary | ICD-10-CM | POA: Diagnosis not present

## 2020-10-25 NOTE — Progress Notes (Signed)
Subjective: Cole Rios is a 30 y.o. male patient seen today painful mycotic nails b/l that are difficult to trim. Pain interferes with ambulation. Aggravating factors include wearing enclosed shoe gear. Pain is relieved with periodic professional debridement.   His Mom is present during today's visit. He has completed the oral terbinafine therapy. They are pleased with appearance of toenails thus far. Mom would like to know about Cole Rios having liver function blood work since he has completed the course of terbinafine.  They voice no new pedal concerns on today's visit.  History reviewed. No pertinent past medical history.  Patient Active Problem List   Diagnosis Date Noted  . Pain due to onychomycosis of toenails of both feet 07/24/2020    Current Outpatient Medications on File Prior to Visit  Medication Sig Dispense Refill  . Multiple Vitamin (MULTIVITAMIN) tablet Take 1 tablet by mouth daily.    . NONFORMULARY OR COMPOUNDED ITEM Terbina 3%, Fluconazole 2%, Tea Tree Oil 5%, Urea 10%, Ibuprofen 2% in DMSO suspension #77ml. Apply to affected nail(s) once (at bedtime) or twice daily. 30 each 5   No current facility-administered medications on file prior to visit.    No Known Allergies  Objective: Physical Exam  General: Patient is a pleasant 30 y.o. Caucasian male WD, WN in NAD. AAO x 3.   Neurovascular Examination: Neurovascular status unchanged b/l lower extremities. Capillary refill time to digits immediate b/l. Palpable pedal pulses b/l LE. Pedal hair present. Lower extremity skin temperature gradient within normal limits.   Protective sensation intact 5/5 intact bilaterally with 10g monofilament b/l. Vibratory sensation intact b/l. Proprioception intact bilaterally.  Dermatological:  Pedal skin with normal turgor, texture and tone bilaterally. No open wounds bilaterally. No interdigital macerations bilaterally. Toenails 1-5 b/l elongated, discolored, dystrophic, thickened,  crumbly with subungual debris and tenderness to dorsal palpation. Improvement in proximal 1/2 of toenails 1-5 b/l. Musculoskeletal:  Normal muscle strength 5/5 to all lower extremity muscle groups bilaterally. No pain crepitus or joint limitation noted with ROM b/l. No gross bony deformities bilaterally.  Assessment and Plan:  1. Pain due to onychomycosis of toenails of both feet    -Examined patient. -He has completed oral terbinafine and has improvement in toenails. -Toenails 1-5 b/l were debrided in length and girth with sterile nail nippers and dremel without iatrogenic bleeding. -Mrs. Kuhrt will discuss with her husband as to whether they would like [place] to have liver function blood work performed. She will call the office and leave me a message if they would like to proceed. -Patient to report any pedal injuries to medical professional immediately. -Patient to continue soft, supportive shoe gear daily. -Patient/POA to call should there be question/concern in the interim.  Return in about 9 weeks (around 12/26/2020).  Cole Rios, DPM

## 2020-12-26 ENCOUNTER — Other Ambulatory Visit: Payer: Self-pay

## 2020-12-26 ENCOUNTER — Ambulatory Visit: Payer: Medicaid Other | Admitting: Podiatry

## 2020-12-26 ENCOUNTER — Encounter: Payer: Self-pay | Admitting: Podiatry

## 2020-12-26 DIAGNOSIS — M79675 Pain in left toe(s): Secondary | ICD-10-CM | POA: Diagnosis not present

## 2020-12-26 DIAGNOSIS — B351 Tinea unguium: Secondary | ICD-10-CM | POA: Diagnosis not present

## 2020-12-26 DIAGNOSIS — M79674 Pain in right toe(s): Secondary | ICD-10-CM

## 2020-12-26 DIAGNOSIS — Q909 Down syndrome, unspecified: Secondary | ICD-10-CM | POA: Insufficient documentation

## 2020-12-26 DIAGNOSIS — L718 Other rosacea: Secondary | ICD-10-CM | POA: Insufficient documentation

## 2020-12-30 NOTE — Progress Notes (Signed)
Subjective: Cole Rios is a 30 y.o. male patient seen today painful mycotic nails b/l that are difficult to trim. Pain interferes with ambulation. Aggravating factors include wearing enclosed shoe gear. Pain is relieved with periodic professional debridement.   His Mom is present during today's visit. They are pleased with appearance of toenails.  They voice no new pedal concerns on today's visit.  No Known Allergies  Objective: Physical Exam  General: Patient is a pleasant 30 y.o. Caucasian male WD, WN in NAD. AAO x 3.   Neurovascular Examination: Neurovascular status unchanged b/l lower extremities. Capillary refill time to digits immediate b/l. Palpable pedal pulses b/l LE. Pedal hair present. Lower extremity skin temperature gradient within normal limits.   Protective sensation intact 5/5 intact bilaterally with 10g monofilament b/l. Vibratory sensation intact b/l. Proprioception intact bilaterally.  Dermatological:  Pedal skin with normal turgor, texture and tone bilaterally. No open wounds bilaterally. No interdigital macerations bilaterally. Toenails 1-5 b/l elongated, discolored, dystrophic, thickened, crumbly with subungual debris and tenderness to dorsal palpation. Improvement in proximal 3/4 of toenails 1-5 b/l. Musculoskeletal:  Normal muscle strength 5/5 to all lower extremity muscle groups bilaterally. No pain crepitus or joint limitation noted with ROM b/l. No gross bony deformities bilaterally.  Assessment and Plan:  1. Pain due to onychomycosis of toenails of both feet    -Examined patient. -He has completed oral terbinafine and toenails have responded well. Expecting resolution at next visit. -Toenails 1-5 b/l were debrided in length and girth with sterile nail nippers and dremel without iatrogenic bleeding.  -Patient to report any pedal injuries to medical professional immediately. -Patient to continue soft, supportive shoe gear daily. -Patient/POA to call  should there be question/concern in the interim.  Return in about 9 weeks (around 02/27/2021) for nail trim.  Freddie Breech, DPM

## 2021-03-20 ENCOUNTER — Other Ambulatory Visit: Payer: Self-pay

## 2021-03-20 ENCOUNTER — Ambulatory Visit: Payer: Medicaid Other | Admitting: Podiatry

## 2021-03-20 DIAGNOSIS — M79675 Pain in left toe(s): Secondary | ICD-10-CM

## 2021-03-20 DIAGNOSIS — B351 Tinea unguium: Secondary | ICD-10-CM

## 2021-03-20 DIAGNOSIS — M79674 Pain in right toe(s): Secondary | ICD-10-CM

## 2021-03-26 ENCOUNTER — Encounter: Payer: Self-pay | Admitting: Podiatry

## 2021-03-26 NOTE — Progress Notes (Signed)
Subjective: Cole Rios is a pleasant 30 y.o. male patient seen today for follow up of onychomysis with oral terbinafine treatment.  Adelbert thinks his nails are doing great. He notes no pain of great toes today.  He is accompanied by his mother on today's visit.  No Known Allergies  Objective: Physical Exam  General: Cole Rios is a pleasant 30 y.o. Caucasian male, WD, WN in NAD. AAO x 3.   Vascular:  Capillary refill time to digits immediate b/l. Palpable pedal pulses b/l LE. Pedal hair present. Lower extremity skin temperature gradient within normal limits. No pain with calf compression b/l. No edema noted b/l lower extremities.  Dermatological:  Nondystrophic toenails 1-5 bilaterally.  Musculoskeletal:  Normal muscle strength 5/5 to all lower extremity muscle groups bilaterally. No pain crepitus or joint limitation noted with ROM b/l. No gross bony deformities bilaterally.  Neurological:  Protective sensation intact 5/5 intact bilaterally with 10g monofilament b/l. Vibratory sensation intact b/l. Proprioception intact bilaterally.  Assessment and Plan:  1. Pain due to onychomycosis of toenails of both feet     -Examined patient. -Patient to continue soft, supportive shoe gear daily. -Nondystrophic toenails trimmed 1-5 bilaterally. -Patient to report any pedal injuries to medical professional immediately. -Patient/POA to call should there be question/concern in the interim.  Return if symptoms worsen or fail to improve.  Freddie Breech, DPM

## 2021-07-04 NOTE — Telephone Encounter (Signed)
error 

## 2023-05-21 ENCOUNTER — Other Ambulatory Visit: Payer: Self-pay

## 2023-05-21 DIAGNOSIS — R1033 Periumbilical pain: Secondary | ICD-10-CM | POA: Diagnosis not present

## 2023-05-21 DIAGNOSIS — R109 Unspecified abdominal pain: Secondary | ICD-10-CM | POA: Diagnosis present

## 2023-05-21 DIAGNOSIS — R1013 Epigastric pain: Secondary | ICD-10-CM | POA: Insufficient documentation

## 2023-05-22 ENCOUNTER — Emergency Department (HOSPITAL_BASED_OUTPATIENT_CLINIC_OR_DEPARTMENT_OTHER)
Admission: EM | Admit: 2023-05-22 | Discharge: 2023-05-22 | Disposition: A | Discharge status: Home / Self Care | Payer: Medicaid Other | Source: Home / Self Care | Attending: Emergency Medicine | Admitting: Emergency Medicine

## 2023-05-22 ENCOUNTER — Encounter (HOSPITAL_BASED_OUTPATIENT_CLINIC_OR_DEPARTMENT_OTHER): Payer: Self-pay | Admitting: Emergency Medicine

## 2023-05-22 ENCOUNTER — Emergency Department (HOSPITAL_BASED_OUTPATIENT_CLINIC_OR_DEPARTMENT_OTHER): Payer: Medicaid Other

## 2023-05-22 DIAGNOSIS — R109 Unspecified abdominal pain: Secondary | ICD-10-CM

## 2023-05-22 LAB — CBC
HCT: 39.4 % (ref 39.0–52.0)
Hemoglobin: 13.8 g/dL (ref 13.0–17.0)
MCH: 33.4 pg (ref 26.0–34.0)
MCHC: 35 g/dL (ref 30.0–36.0)
MCV: 95.4 fL (ref 80.0–100.0)
Platelets: 216 10*3/uL (ref 150–400)
RBC: 4.13 MIL/uL — ABNORMAL LOW (ref 4.22–5.81)
RDW: 13.6 % (ref 11.5–15.5)
WBC: 15.9 10*3/uL — ABNORMAL HIGH (ref 4.0–10.5)
nRBC: 0 % (ref 0.0–0.2)

## 2023-05-22 LAB — COMPREHENSIVE METABOLIC PANEL
ALT: 38 U/L (ref 0–44)
AST: 34 U/L (ref 15–41)
Albumin: 4.6 g/dL (ref 3.5–5.0)
Alkaline Phosphatase: 59 U/L (ref 38–126)
Anion gap: 14 (ref 5–15)
BUN: 17 mg/dL (ref 6–20)
CO2: 25 mmol/L (ref 22–32)
Calcium: 10 mg/dL (ref 8.9–10.3)
Chloride: 100 mmol/L (ref 98–111)
Creatinine, Ser: 0.89 mg/dL (ref 0.61–1.24)
GFR, Estimated: >60 mL/min (ref >60)
Glucose, Bld: 181 mg/dL — ABNORMAL HIGH (ref 70–99)
Potassium: 3.3 mmol/L — ABNORMAL LOW (ref 3.5–5.1)
Sodium: 139 mmol/L (ref 135–145)
Total Bilirubin: 0.9 mg/dL (ref 0.3–1.2)
Total Protein: 8 g/dL (ref 6.5–8.1)

## 2023-05-22 LAB — LIPASE, BLOOD: Lipase: 67 U/L — ABNORMAL HIGH (ref 11–51)

## 2023-05-22 MED ORDER — IOHEXOL 300 MG/ML  SOLN
100.0000 mL | Freq: Once | INTRAMUSCULAR | Status: AC | PRN
  Administered 2023-05-22: 75 mL via INTRAVENOUS

## 2023-05-22 MED ORDER — SODIUM CHLORIDE 0.9 % IV BOLUS
1000.0000 mL | Freq: Once | INTRAVENOUS | Status: AC
  Administered 2023-05-22: 1000 mL via INTRAVENOUS

## 2023-05-22 MED ORDER — MORPHINE SULFATE (PF) 4 MG/ML IV SOLN
4.0000 mg | Freq: Once | INTRAVENOUS | Status: AC
  Administered 2023-05-22: 4 mg via INTRAVENOUS
  Filled 2023-05-22: qty 1

## 2023-05-22 MED ORDER — HYDROCODONE-ACETAMINOPHEN 5-325 MG PO TABS: 1.0000 | ORAL_TABLET | Freq: Four times a day (QID) | ORAL | 0 refills | Status: AC | PRN

## 2023-05-22 MED ORDER — ONDANSETRON HCL 4 MG/2ML IJ SOLN
4.0000 mg | Freq: Once | INTRAMUSCULAR | Status: AC
  Administered 2023-05-22: 4 mg via INTRAVENOUS
  Filled 2023-05-22: qty 2

## 2023-05-22 NOTE — ED Notes (Signed)
Pt to CT

## 2023-05-22 NOTE — ED Triage Notes (Signed)
Abdo pain started around 7pm. N/v x6 small amount. No fever

## 2023-05-22 NOTE — ED Provider Notes (Signed)
[place] EMERGENCY DEPARTMENT AT [facility] Provider Note   CSN: [id number] Arrival date & time: 05/21/23  2350     History  Chief Complaint  Patient presents with   Abdominal Pain    Cole Rios is a 32 y.o. male.  Patient is a 32 year old male with past medical history of Down syndrome, VSD repair as an infant, rosacea.  Patient brought by his parents for evaluation of abdominal pain.  This apparently started earlier this afternoon.  He has had several episodes of nausea and vomiting.  No fevers or chills.  He did have a bowel movement prior to the onset of pain.  Parents deny prior abdominal surgeries.  The history is provided by the patient.       Home Medications Prior to Admission medications   Medication Sig Start Date End Date Taking? Authorizing Provider  Ibuprofen (ADVIL PO) 2 tablets    [provider]  Multiple Vitamin (MULTIVITAMIN) tablet Take 1 tablet by mouth daily.    [provider]  NONFORMULARY OR COMPOUNDED ITEM Terbina 3%, Fluconazole 2%, Tea Tree Oil 5%, Urea 10%, Ibuprofen 2% in DMSO suspension #47ml. Apply to affected nail(s) once (at bedtime) or twice daily. 12/19/19   Vivi Barrack, DPM  simethicone (GAS-X) 80 MG chewable tablet 1 tablet as needed    [provider]      Allergies    Patient has no known allergies.    Review of Systems   Review of Systems  All other systems reviewed and are negative.   Physical Exam Updated Vital Signs BP 111/72 (BP Location: Right Arm)   Pulse (!) 48   Temp 98.3 F (36.8 C) (Oral)   Resp 16   SpO2 100%  Physical Exam Vitals and nursing note reviewed.  Constitutional:      General: He is not in acute distress.    Appearance: He is well-developed. He is not diaphoretic.  HENT:     Head: Normocephalic and atraumatic.  Cardiovascular:     Rate and Rhythm: Normal rate and regular rhythm.     Heart sounds: No murmur heard.    No friction rub.  Pulmonary:      Effort: Pulmonary effort is normal. No respiratory distress.     Breath sounds: Normal breath sounds. No wheezing or rales.  Abdominal:     General: Bowel sounds are normal. There is no distension.     Palpations: Abdomen is soft.     Tenderness: There is abdominal tenderness in the epigastric area and periumbilical area. There is no right CVA tenderness, left CVA tenderness, guarding or rebound.  Musculoskeletal:        General: Normal range of motion.     Cervical back: Normal range of motion and neck supple.  Skin:    General: Skin is warm and dry.  Neurological:     Mental Status: He is alert and oriented to person, place, and time.     Coordination: Coordination normal.     ED Results / Procedures / Treatments   Labs (all labs ordered are listed, but only abnormal results are displayed) Labs Reviewed  LIPASE, BLOOD - Abnormal; Notable for the following components:      Result Value   Lipase 67 (*)    All other components within normal limits  COMPREHENSIVE METABOLIC PANEL - Abnormal; Notable for the following components:   Potassium 3.3 (*)    Glucose, Bld 181 (*)    All other components  within normal limits  CBC - Abnormal; Notable for the following components:   WBC 15.9 (*)    RBC 4.13 (*)    All other components within normal limits    EKG None  Radiology No results found.  Procedures Procedures    Medications Ordered in ED Medications  sodium chloride 0.9 % bolus 1,000 mL (has no administration in time range)  ondansetron (ZOFRAN) injection 4 mg (has no administration in time range)  morphine (PF) 4 MG/ML injection 4 mg (has no administration in time range)    ED Course/ Medical Decision Making/ A&P  Patient is a 32 year old male presenting with abdominal pain as described in the HPI.  Patient arrives here with stable vital signs and is afebrile.  Physical examination reveals tenderness in the periumbilical/epigastric region, but no peritoneal  signs.  Workup initiated including CBC, CMP, and lipase.  He does have a white count of 16,000 along with a mildly elevated lipase, but studies otherwise are unremarkable.  CT scan of the abdomen and pelvis obtained showing no acute intra-abdominal pathology.  Patient has been given IV fluids along with morphine and Zofran.  He is now feeling better and tolerating liquids without difficulty.  Exact causes of his abdominal pain is unclear, but nothing appears emergent.  Perhaps symptoms are viral in nature as he did have vomiting prior to coming here.  He does have a mild elevation of his lipase, but no evidence for pancreatitis on the CT scan.  Final Clinical Impression(s) / ED Diagnoses Final diagnoses:  None    Rx / DC Orders ED Discharge Orders     None         Geoffery Lyons, MD 05/22/23 [phone]

## 2023-05-22 NOTE — Discharge Instructions (Signed)
Begin taking hydrocodone as prescribed as needed for pain.  Clear liquids for the next 12 hours, then slowly advance to a bland diet as tolerated.  Return to the emergency department for worsening pain, high fevers, bloody stool or vomit, or for other new and concerning symptoms.
